# Patient Record
Sex: Female | Born: 1975 | Race: Black or African American | Hispanic: No | Marital: Single | Smoking: Never smoker
Health system: Southern US, Community
[De-identification: ages and names within clinical notes are randomized; demographics above are authoritative.]

## PROBLEM LIST (undated history)

## (undated) DIAGNOSIS — D219 Benign neoplasm of connective and other soft tissue, unspecified: Secondary | ICD-10-CM

## (undated) DIAGNOSIS — J45909 Unspecified asthma, uncomplicated: Secondary | ICD-10-CM

## (undated) DIAGNOSIS — O09529 Supervision of elderly multigravida, unspecified trimester: Secondary | ICD-10-CM

## (undated) HISTORY — DX: Benign neoplasm of connective and other soft tissue, unspecified: D21.9

## (undated) HISTORY — DX: Unspecified asthma, uncomplicated: J45.909

## (undated) HISTORY — PX: HYSTEROSCOPY: SHX211

## (undated) HISTORY — PX: TONSILLECTOMY: SUR1361

## (undated) HISTORY — DX: Supervision of elderly multigravida, unspecified trimester: O09.529

---

## 1998-05-30 ENCOUNTER — Other Ambulatory Visit: Admission: RE | Admit: 1998-05-30 | Discharge: 1998-05-30 | Payer: Self-pay | Admitting: Emergency Medicine

## 1999-01-01 ENCOUNTER — Encounter: Admission: RE | Admit: 1999-01-01 | Discharge: 1999-01-01 | Payer: Self-pay | Admitting: Gastroenterology

## 1999-06-26 ENCOUNTER — Other Ambulatory Visit: Admission: RE | Admit: 1999-06-26 | Discharge: 1999-06-26 | Payer: Self-pay | Admitting: Emergency Medicine

## 2000-07-24 ENCOUNTER — Other Ambulatory Visit: Admission: RE | Admit: 2000-07-24 | Discharge: 2000-07-24 | Payer: Self-pay | Admitting: Family Medicine

## 2002-10-27 ENCOUNTER — Other Ambulatory Visit: Admission: RE | Admit: 2002-10-27 | Discharge: 2002-10-27 | Payer: Self-pay | Admitting: Family Medicine

## 2003-11-21 ENCOUNTER — Other Ambulatory Visit: Admission: RE | Admit: 2003-11-21 | Discharge: 2003-11-21 | Payer: Self-pay | Admitting: Family Medicine

## 2004-11-16 ENCOUNTER — Other Ambulatory Visit: Admission: RE | Admit: 2004-11-16 | Discharge: 2004-11-16 | Payer: Self-pay | Admitting: Family Medicine

## 2006-01-20 ENCOUNTER — Other Ambulatory Visit: Admission: RE | Admit: 2006-01-20 | Discharge: 2006-01-20 | Payer: Self-pay | Admitting: Family Medicine

## 2008-08-18 ENCOUNTER — Other Ambulatory Visit: Admission: RE | Admit: 2008-08-18 | Discharge: 2008-08-18 | Payer: Self-pay | Admitting: Family Medicine

## 2010-01-10 ENCOUNTER — Other Ambulatory Visit: Admission: RE | Admit: 2010-01-10 | Discharge: 2010-01-10 | Payer: Self-pay | Admitting: Family Medicine

## 2010-08-09 ENCOUNTER — Other Ambulatory Visit: Payer: Self-pay | Admitting: Obstetrics and Gynecology

## 2010-08-09 ENCOUNTER — Ambulatory Visit (HOSPITAL_BASED_OUTPATIENT_CLINIC_OR_DEPARTMENT_OTHER): Admission: RE | Admit: 2010-08-09 | Payer: Self-pay | Source: Ambulatory Visit | Admitting: Obstetrics and Gynecology

## 2010-08-09 ENCOUNTER — Ambulatory Visit (HOSPITAL_COMMUNITY)
Admission: RE | Admit: 2010-08-09 | Discharge: 2010-08-09 | Disposition: A | Discharge status: Home / Self Care | Payer: Medicaid Other | Source: Ambulatory Visit | Attending: Obstetrics and Gynecology | Admitting: Obstetrics and Gynecology

## 2010-08-09 DIAGNOSIS — N938 Other specified abnormal uterine and vaginal bleeding: Secondary | ICD-10-CM | POA: Insufficient documentation

## 2010-08-09 DIAGNOSIS — N949 Unspecified condition associated with female genital organs and menstrual cycle: Secondary | ICD-10-CM | POA: Insufficient documentation

## 2010-08-09 DIAGNOSIS — D250 Submucous leiomyoma of uterus: Secondary | ICD-10-CM | POA: Insufficient documentation

## 2010-08-09 LAB — CBC
MCH: 27.6 pg (ref 26.0–34.0)
MCV: 83.9 fL (ref 78.0–100.0)
Platelets: 240 10*3/uL (ref 150–400)
RBC: 4.34 MIL/uL (ref 3.87–5.11)
RDW: 15.4 % (ref 11.5–15.5)
WBC: 4.9 10*3/uL (ref 4.0–10.5)

## 2010-08-09 LAB — BASIC METABOLIC PANEL
BUN: 8 mg/dL (ref 6–23)
Calcium: 9.1 mg/dL (ref 8.4–10.5)
GFR calc non Af Amer: >60 mL/min (ref >60)
Glucose, Bld: 89 mg/dL (ref 70–99)
Potassium: 3.6 mEq/L (ref 3.5–5.1)
Sodium: 133 mEq/L — ABNORMAL LOW (ref 135–145)

## 2010-08-09 LAB — PREGNANCY, URINE: Preg Test, Ur: NEGATIVE

## 2010-08-13 NOTE — Op Note (Signed)
  [name], Elizabeth Hensley                ACCOUNT NO.:  [id number]  MEDICAL RECORD NO.:  [id number]           PATIENT TYPE:  O  LOCATION:  [facility]                          [facility]  PHYSICIAN:  Crist Fat. Romualdo Prosise, M.D. DATE OF BIRTH:  [birth year]  DATE OF PROCEDURE:  08/09/2010 DATE OF DISCHARGE:                              OPERATIVE REPORT   PREOPERATIVE DIAGNOSIS:  Submucosal fibroid with menorrhagia.  POSTOPERATIVE DIAGNOSIS:  Submucosal fibroid with menorrhagia.  ANESTHESIA:  General.  PROCEDURES:  Hysteroscopy, resection of fibroid with VersaPoint, and D and C.  SURGEON:  Excell Neyland A. Amala Petion, M.D.  ASSISTANT:  No assistant.  ESTIMATED BLOOD LOSS:  Minimal.  WATER DEFICIT:  230 mL.  PROCEDURE:  After being informed of the planned procedure with possible complications including bleeding, infection, injury to uterus, and subsequently injury to intraabdominal organs.  Informed consent was obtained.  The patient was taken to OR #8, given general anesthesia with laryngeal mask ventilation without any complication.  She was placed in the lithotomy position, prepped and draped in a sterile fashion, and her bladder was emptied with an in-and-out red rubber catheter.  She also has knee-high sequential compressive devices.  Pelvic exam revealed a retroverted uterus, slightly enlarged, but regular in shape to normal adnexa.  A weighted speculum was inserted in the vagina and anterior lip of the cervix was grasped with a Jacobs forceps.  We proceeded with a paracervical block using 18 mL of Novocaine 1% and the uterus was sounded at 10 cm.  The cervix was easily dilated using Hegar dilator until #33 which allowed easy entry of the operative hysteroscope.  With perfusion of LR at a maximum pressure of 80 mmHg, we were able to visualize the intrauterine cavity with both tubal ostia.  Endometrium appeared thickened with one polyp at the fundus.  The patient was day #20 of a 28-day cycle.   We did see in the anterior fundal right sided a slight bulge in the endometrial cavity which could represent the previously identified submucosal fibroid, but unfortunately the submucosal component was very little.  Using the VersaPoint loop, we resected that portion until the anterior wall was completely flattened and we also resected the endometrial polyp.  We then removed the hysteroscope, proceeded with sharp curettage removing a large amount of normal-appearing endometrium.  Hysteroscope was reinserted to note a normal endometrial cavity.  All instruments were then removed.  Instruments and sponge count was complete x2.  Estimated blood loss was minimal and water deficit is 230 mL.  The procedure was well tolerated by the patient who was taken to recovery room in well and stable condition.  SPECIMEN:  Endometrial curettings, endometrial resections were sent to Pathology.     Crist Fat Kayani Rapaport, M.D.     SAR/MEDQ  D:  08/09/2010  T:  08/10/2010  Job:  [id number]  Electronically Signed by Silverio Lay M.D. on 08/13/2010 04:48:13 PM

## 2011-03-26 NOTE — L&D Delivery Note (Signed)
Patient was C/C+1 and pushed for just over 120 minutes without epidural.   Vacuum assisted delivery with one pop off of  Female infant, Occiput posterior, Apgars 9/9, weight pending.   The patient had a minor L labial abrasion, not bleeding. Fundus was firm, after manual extraction of approx 200cc clot. EBL was expected. Placenta was delivered intact. Vagina was clear.  Baby was vigorous to bedside.  Philip Aspen

## 2011-05-02 LAB — OB RESULTS CONSOLE RPR: RPR: NONREACTIVE

## 2011-05-02 LAB — OB RESULTS CONSOLE GC/CHLAMYDIA
Chlamydia: NEGATIVE
Gonorrhea: NEGATIVE

## 2011-05-02 LAB — OB RESULTS CONSOLE RUBELLA ANTIBODY, IGM: Rubella: IMMUNE

## 2011-05-02 LAB — OB RESULTS CONSOLE HIV ANTIBODY (ROUTINE TESTING): HIV: NONREACTIVE

## 2011-10-30 ENCOUNTER — Telehealth (HOSPITAL_COMMUNITY): Payer: Self-pay | Admitting: *Deleted

## 2011-10-30 NOTE — Telephone Encounter (Signed)
Preadmission screen  

## 2011-10-31 ENCOUNTER — Encounter (HOSPITAL_COMMUNITY): Payer: Self-pay | Admitting: *Deleted

## 2011-10-31 ENCOUNTER — Inpatient Hospital Stay (HOSPITAL_COMMUNITY)
Admission: AD | Admit: 2011-10-31 | Discharge: 2011-11-02 | DRG: 775 | Disposition: A | Discharge status: Home / Self Care | Payer: Medicaid Other | Source: Ambulatory Visit | Attending: Obstetrics and Gynecology | Admitting: Obstetrics and Gynecology

## 2011-10-31 DIAGNOSIS — O09529 Supervision of elderly multigravida, unspecified trimester: Secondary | ICD-10-CM | POA: Diagnosis present

## 2011-10-31 NOTE — MAU Note (Signed)
PT SAYS SROM AT 2209- CLEAR.   VE IN OFFICE - 4 CM.   DENIES HSV AND MERSA.    HURT BAD  AT 2300

## 2011-11-01 ENCOUNTER — Encounter (HOSPITAL_COMMUNITY): Payer: Self-pay | Admitting: Obstetrics

## 2011-11-01 LAB — CBC
HCT: 36.1 % (ref 36.0–46.0)
MCH: 28.1 pg (ref 26.0–34.0)
MCH: 28.3 pg (ref 26.0–34.0)
MCHC: 32.5 g/dL (ref 30.0–36.0)
MCV: 85.7 fL (ref 78.0–100.0)
MCV: 86.2 fL (ref 78.0–100.0)
Platelets: 190 10*3/uL (ref 150–400)
RBC: 4.21 MIL/uL (ref 3.87–5.11)
RDW: 18 % — ABNORMAL HIGH (ref 11.5–15.5)
WBC: 8.1 10*3/uL (ref 4.0–10.5)

## 2011-11-01 LAB — TYPE AND SCREEN: Antibody Screen: NEGATIVE

## 2011-11-01 MED ORDER — BENZOCAINE-MENTHOL 20-0.5 % EX AERO
1.0000 "application " | INHALATION_SPRAY | CUTANEOUS | Status: DC | PRN
  Filled 2011-11-01: qty 56

## 2011-11-01 MED ORDER — IBUPROFEN 600 MG PO TABS
600.0000 mg | ORAL_TABLET | Freq: Four times a day (QID) | ORAL | Status: DC
  Administered 2011-11-01 – 2011-11-02 (×6): 600 mg via ORAL
  Filled 2011-11-01 (×6): qty 1

## 2011-11-01 MED ORDER — SIMETHICONE 80 MG PO CHEW: 80.0000 mg | CHEWABLE_TABLET | ORAL | Status: DC | PRN

## 2011-11-01 MED ORDER — OXYTOCIN 40 UNITS IN LACTATED RINGERS INFUSION - SIMPLE MED
62.5000 mL/h | Freq: Once | INTRAVENOUS | Status: AC
  Administered 2011-11-01: 62.5 mL/h via INTRAVENOUS
  Filled 2011-11-01: qty 1000

## 2011-11-01 MED ORDER — FLEET ENEMA 7-19 GM/118ML RE ENEM: 1.0000 | ENEMA | RECTAL | Status: DC | PRN

## 2011-11-01 MED ORDER — OXYTOCIN BOLUS FROM INFUSION
250.0000 mL | Freq: Once | INTRAVENOUS | Status: DC
  Filled 2011-11-01: qty 500

## 2011-11-01 MED ORDER — DIPHENHYDRAMINE HCL 25 MG PO CAPS: 25.0000 mg | ORAL_CAPSULE | Freq: Four times a day (QID) | ORAL | Status: DC | PRN

## 2011-11-01 MED ORDER — ONDANSETRON HCL 4 MG/2ML IJ SOLN: 4.0000 mg | INTRAMUSCULAR | Status: DC | PRN

## 2011-11-01 MED ORDER — SENNOSIDES-DOCUSATE SODIUM 8.6-50 MG PO TABS
2.0000 | ORAL_TABLET | Freq: Every day | ORAL | Status: DC
  Administered 2011-11-01: 2 via ORAL

## 2011-11-01 MED ORDER — ONDANSETRON HCL 4 MG PO TABS: 4.0000 mg | ORAL_TABLET | ORAL | Status: DC | PRN

## 2011-11-01 MED ORDER — OXYCODONE-ACETAMINOPHEN 5-325 MG PO TABS
1.0000 | ORAL_TABLET | ORAL | Status: DC | PRN
  Administered 2011-11-01: 1 via ORAL
  Administered 2011-11-02: 2 via ORAL
  Filled 2011-11-01: qty 1
  Filled 2011-11-01: qty 2

## 2011-11-01 MED ORDER — ZOLPIDEM TARTRATE 5 MG PO TABS: 5.0000 mg | ORAL_TABLET | Freq: Every evening | ORAL | Status: DC | PRN

## 2011-11-01 MED ORDER — ACETAMINOPHEN 325 MG PO TABS: 650.0000 mg | ORAL_TABLET | ORAL | Status: DC | PRN

## 2011-11-01 MED ORDER — DIBUCAINE 1 % RE OINT: 1.0000 "application " | TOPICAL_OINTMENT | RECTAL | Status: DC | PRN

## 2011-11-01 MED ORDER — IBUPROFEN 600 MG PO TABS: 600.0000 mg | ORAL_TABLET | Freq: Four times a day (QID) | ORAL | Status: DC | PRN

## 2011-11-01 MED ORDER — LIDOCAINE HCL (PF) 1 % IJ SOLN
30.0000 mL | INTRAMUSCULAR | Status: DC | PRN
  Administered 2011-11-01: 30 mL via SUBCUTANEOUS
  Filled 2011-11-01: qty 30

## 2011-11-01 MED ORDER — PRENATAL MULTIVITAMIN CH
1.0000 | ORAL_TABLET | Freq: Every day | ORAL | Status: DC
  Administered 2011-11-02: 1 via ORAL
  Filled 2011-11-01: qty 1

## 2011-11-01 MED ORDER — ONDANSETRON HCL 4 MG/2ML IJ SOLN: 4.0000 mg | Freq: Four times a day (QID) | INTRAMUSCULAR | Status: DC | PRN

## 2011-11-01 MED ORDER — LACTATED RINGERS IV SOLN
INTRAVENOUS | Status: DC
  Administered 2011-11-01: 01:00:00 via INTRAVENOUS

## 2011-11-01 MED ORDER — LACTATED RINGERS IV SOLN: INTRAVENOUS | Status: DC

## 2011-11-01 MED ORDER — LANOLIN HYDROUS EX OINT: TOPICAL_OINTMENT | CUTANEOUS | Status: DC | PRN

## 2011-11-01 MED ORDER — TETANUS-DIPHTH-ACELL PERTUSSIS 5-2.5-18.5 LF-MCG/0.5 IM SUSP: 0.5000 mL | Freq: Once | INTRAMUSCULAR | Status: DC

## 2011-11-01 MED ORDER — OXYCODONE-ACETAMINOPHEN 5-325 MG PO TABS: 1.0000 | ORAL_TABLET | ORAL | Status: DC | PRN

## 2011-11-01 MED ORDER — LACTATED RINGERS IV SOLN
500.0000 mL | INTRAVENOUS | Status: DC | PRN
Start: 2011-11-01 — End: 2011-11-01

## 2011-11-01 MED ORDER — WITCH HAZEL-GLYCERIN EX PADS: 1.0000 "application " | MEDICATED_PAD | CUTANEOUS | Status: DC | PRN

## 2011-11-01 MED ORDER — CITRIC ACID-SODIUM CITRATE 334-500 MG/5ML PO SOLN
30.0000 mL | ORAL | Status: DC | PRN
  Filled 2011-11-01: qty 15

## 2011-11-01 MED ORDER — TERBUTALINE SULFATE 1 MG/ML IJ SOLN
INTRAMUSCULAR | Status: AC
  Administered 2011-11-01: 1 mg via INTRAMUSCULAR
  Filled 2011-11-01: qty 1

## 2011-11-01 NOTE — Progress Notes (Signed)
Post Partum Day 0 Subjective: no complaints, up ad lib, voiding and tolerating PO  Objective: Blood pressure 96/53, pulse 80, temperature 97.6 F (36.4 C), temperature source Oral, resp. rate 18, height 5\' 5"  (1.651 m), weight 76.204 kg (168 lb), SpO2 99.00%, unknown if currently breastfeeding.  Physical Exam:  General: alert, cooperative and appears stated age Lochia: appropriate Uterine Fundus: firm   Basename 11/01/11 0600 11/01/11 0035  HGB 10.8* 11.9*  HCT 33.2* 36.1    Assessment/Plan: Plan for discharge tomorrow and Breastfeeding Desires neonatal circ. R/B/A reviewed. Needs to pay circ fee    LOS: 1 day   Elizabeth Hensley H. 11/01/2011, 10:34 AM

## 2011-11-01 NOTE — H&P (Addendum)
36 y.o. [redacted]w[redacted]d  G2P1001 comes in c/o SROM at 10pm, feeling contractions  4cm in office.  Otherwise has good fetal movement and no bleeding.  Last delivery was 35yrs ago- no complications.  Past Medical History  Diagnosis Date  . Asthma     proventil  . AMA (advanced maternal age) multigravida 35+   . Fibroid     also a polyp    Past Surgical History  Procedure Date  . Hysteroscopy     versapoint hysteroscopic resection of Fibroids 07/2010  . Tonsillectomy     and adenoids    OB History    Grav Para Term Preterm Abortions TAB SAB Ect Mult Living   2 1 1       1      # Outc Date GA Lbr Len/2nd Wgt Sex Del Anes PTL Lv   1 TRM 1993 [redacted]w[redacted]d 05:00 6lb7oz(2.92kg) M SVD   Yes   2 CUR               History   Social History  . Marital Status: Single    Spouse Name: N/A    Number of Children: N/A  . Years of Education: N/A   Occupational History  . Not on file.   Social History Main Topics  . Smoking status: Never Smoker   . Smokeless tobacco: Never Used  . Alcohol Use: No  . Drug Use: No  . Sexually Active: Yes    Birth Control/ Protection: Pill   Other Topics Concern  . Not on file   Social History Narrative  . No narrative on file   Sulfa antibiotics; Vicodin; and Zinc   Prenatal Course:  AMA- nl integrated screen, hx of hysteroscopic fibroid removal  Filed Vitals:   11/01/11 0144  BP:   Pulse: 118  Temp: 97.7 F (36.5 C)  Resp: 20     Lungs/Cor:  NAD Abdomen:  soft, gravid Ex:  no cords, erythema SVE:  8 per MAU FHTs:  140s, good STV, NST R Toco:  q2   A/P   Admit L&D Pt declines epidural Routine orders  GBS Neg  Quantel Mcinturff

## 2011-11-01 NOTE — Progress Notes (Signed)
UR chart review completed.  

## 2011-11-02 MED ORDER — IBUPROFEN 600 MG PO TABS: 600.0000 mg | ORAL_TABLET | Freq: Four times a day (QID) | ORAL | Status: AC | PRN

## 2011-11-02 MED ORDER — OXYCODONE-ACETAMINOPHEN 5-325 MG PO TABS: 2.0000 | ORAL_TABLET | ORAL | Status: AC | PRN

## 2011-11-02 MED ORDER — DOCUSATE SODIUM 100 MG PO CAPS: 100.0000 mg | ORAL_CAPSULE | Freq: Two times a day (BID) | ORAL | Status: AC

## 2011-11-02 NOTE — Discharge Summary (Signed)
Obstetric Discharge Summary Reason for Admission: onset of labor Prenatal Procedures: ultrasound Intrapartum Procedures: vacuum Postpartum Procedures: none Complications-Operative and Postpartum: none Hemoglobin  Date Value Range Status  11/01/2011 10.8* 12.0 - 15.0 g/dL Final     HCT  Date Value Range Status  11/01/2011 33.2* 36.0 - 46.0 % Final    Physical Exam:  General: alert, cooperative and appears stated age 36: appropriate Uterine Fundus: firm  Discharge Diagnoses: Term Pregnancy-delivered  Discharge Information: Date: 11/02/2011 Activity: pelvic rest Diet: routine Medications: Ibuprofen, Colace and Vicodin Condition: improved Instructions: refer to practice specific booklet Discharge to: home Follow-up Information    Follow up with Elizabeth A, MD in 4 weeks. (For Hensley postpartum evaluation)    Contact information:   [address] [place] [address] [phone]          Newborn Data: Live born female  Birth Weight: 8 lb 9 oz (3884 g) APGAR: 8, 9  Home with mother.  Elizabeth Hensley. 11/02/2011, 8:37 AM

## 2011-11-05 ENCOUNTER — Inpatient Hospital Stay (HOSPITAL_COMMUNITY): Admission: RE | Admit: 2011-11-05 | Payer: Medicaid Other | Source: Ambulatory Visit

## 2012-01-22 ENCOUNTER — Ambulatory Visit: Payer: Self-pay | Admitting: Obstetrics and Gynecology

## 2012-03-12 ENCOUNTER — Encounter: Payer: Self-pay | Admitting: Obstetrics and Gynecology

## 2012-03-12 ENCOUNTER — Ambulatory Visit (INDEPENDENT_AMBULATORY_CARE_PROVIDER_SITE_OTHER): Payer: Medicaid Other | Admitting: Obstetrics and Gynecology

## 2012-03-12 VITALS — BP 100/68 | Ht 60.5 in | Wt 139.0 lb

## 2012-03-12 DIAGNOSIS — Z01419 Encounter for gynecological examination (general) (routine) without abnormal findings: Secondary | ICD-10-CM

## 2012-03-12 DIAGNOSIS — Z124 Encounter for screening for malignant neoplasm of cervix: Secondary | ICD-10-CM

## 2012-03-12 DIAGNOSIS — Z0000 Encounter for general adult medical examination without abnormal findings: Secondary | ICD-10-CM

## 2012-03-12 MED ORDER — NORGESTIMATE-ETH ESTRADIOL 0.25-35 MG-MCG PO TABS: 1.0000 | ORAL_TABLET | Freq: Every day | ORAL | Status: AC

## 2012-03-12 NOTE — Progress Notes (Signed)
The patient reports:just had a vaginal delivery on 11/01/2011 Baby boy named Tristen   Contraception:Sprintec  Last mammogram: 09/2010 Normal Last pap:01/18/2011   GC/Chlamydia cultures offered: declined HIV/RPR/HbsAg offered:  declined HSV 1 and 2 glycoprotein offered: declined  Menstrual cycle regular and monthly: Yes Menstrual flow normal: Yes  Urinary symptoms: none Normal bowel movements: Yes Reports abuse at home: No:   Subjective:    Elizabeth Hensley is a 36 y.o. female, G2P2002, who presents for an annual exam.     History   Social History  . Marital Status: Single    Spouse Name: N/A    Number of Children: N/A  . Years of Education: N/A   Social History Main Topics  . Smoking status: Never Smoker   . Smokeless tobacco: Never Used  . Alcohol Use: No  . Drug Use: No  . Sexually Active: Yes    Birth Control/ Protection: Pill   Other Topics Concern  . Not on file   Social History Narrative  . No narrative on file    Menstrual cycle:   LMP: No LMP recorded.           Cycle: normal  The following portions of the patient's history were reviewed and updated as appropriate: allergies, current medications, past family history, past medical history, past social history, past surgical history and problem list.  Review of Systems Pertinent items are noted in HPI. Breast:Negative for breast lump,nipple discharge or nipple retraction Gastrointestinal: Negative for abdominal pain, change in bowel habits or rectal bleeding Urinary:negative   Objective:    Breastfeeding? Unknown    Weight:  Wt Readings from Last 1 Encounters:  11/01/11 168 lb (76.204 kg)          BMI: There is no height or weight on file to calculate BMI.  General Appearance: Alert, appropriate appearance for age. No acute distress HEENT: Grossly normal Neck / Thyroid: Supple, no masses, nodes or enlargement Lungs: clear to auscultation bilaterally Back: No CVA tenderness Breast Exam: No masses  or nodes.No dimpling, nipple retraction or discharge. Cardiovascular: Regular rate and rhythm. S1, S2, no murmur Gastrointestinal: Soft, non-tender, no masses or organomegaly Pelvic Exam: Vulva and vagina appear normal. Bimanual exam reveals normal uterus and adnexa.RV Rectovaginal: not indicated Lymphatic Exam: Non-palpable nodes in neck, clavicular, axillary, or inguinal regions Skin: no rash or abnormalities Neurologic: Normal gait and speech, no tremor  Psychiatric: Alert and oriented, appropriate affect.     Assessment:    Normal gyn exam Contraceptive management    Plan:    pap smear return annually or prn STD screening: declined Contraception:oral contraceptives (estrogen/progesterone)Sprintec   Silverio Lay MD

## 2012-03-13 LAB — PAP IG W/ RFLX HPV ASCU

## 2012-12-18 ENCOUNTER — Other Ambulatory Visit: Payer: Self-pay | Admitting: Dermatology

## 2012-12-26 ENCOUNTER — Encounter (HOSPITAL_COMMUNITY): Payer: Self-pay

## 2012-12-26 ENCOUNTER — Emergency Department (HOSPITAL_COMMUNITY)
Admission: EM | Admit: 2012-12-26 | Discharge: 2012-12-27 | Disposition: A | Discharge status: Home / Self Care | Payer: Medicaid Other | Attending: Emergency Medicine | Admitting: Emergency Medicine

## 2012-12-26 DIAGNOSIS — M546 Pain in thoracic spine: Secondary | ICD-10-CM

## 2012-12-26 DIAGNOSIS — Z79899 Other long term (current) drug therapy: Secondary | ICD-10-CM | POA: Insufficient documentation

## 2012-12-26 DIAGNOSIS — J45909 Unspecified asthma, uncomplicated: Secondary | ICD-10-CM | POA: Insufficient documentation

## 2012-12-26 DIAGNOSIS — R109 Unspecified abdominal pain: Secondary | ICD-10-CM | POA: Insufficient documentation

## 2012-12-26 DIAGNOSIS — IMO0002 Reserved for concepts with insufficient information to code with codable children: Secondary | ICD-10-CM | POA: Insufficient documentation

## 2012-12-26 DIAGNOSIS — Z3202 Encounter for pregnancy test, result negative: Secondary | ICD-10-CM | POA: Insufficient documentation

## 2012-12-26 DIAGNOSIS — Z8742 Personal history of other diseases of the female genital tract: Secondary | ICD-10-CM | POA: Insufficient documentation

## 2012-12-26 LAB — URINALYSIS, ROUTINE W REFLEX MICROSCOPIC
Bilirubin Urine: NEGATIVE
Leukocytes, UA: NEGATIVE
Nitrite: NEGATIVE
Specific Gravity, Urine: 1.025 (ref 1.005–1.030)
Urobilinogen, UA: 0.2 mg/dL (ref 0.0–1.0)

## 2012-12-26 LAB — CBC WITH DIFFERENTIAL/PLATELET
Hemoglobin: 12.5 g/dL (ref 12.0–15.0)
Lymphs Abs: 2.6 10*3/uL (ref 0.7–4.0)
Monocytes Relative: 10 % (ref 3–12)
Neutro Abs: 1.6 10*3/uL — ABNORMAL LOW (ref 1.7–7.7)
Neutrophils Relative %: 32 % — ABNORMAL LOW (ref 43–77)
RBC: 4.19 MIL/uL (ref 3.87–5.11)

## 2012-12-26 MED ORDER — OXYCODONE-ACETAMINOPHEN 5-325 MG PO TABS
1.0000 | ORAL_TABLET | Freq: Once | ORAL | Status: AC
  Administered 2012-12-26: 1 via ORAL
  Filled 2012-12-26: qty 1

## 2012-12-26 NOTE — ED Notes (Signed)
Having pain in my right flank, stomach, and back per pt. Started several days ago and is getting worse per pt.

## 2012-12-27 ENCOUNTER — Emergency Department (HOSPITAL_COMMUNITY): Payer: Medicaid Other

## 2012-12-27 LAB — COMPREHENSIVE METABOLIC PANEL
ALT: 6 U/L (ref 0–35)
Albumin: 3.5 g/dL (ref 3.5–5.2)
BUN: 9 mg/dL (ref 6–23)
Calcium: 9.4 mg/dL (ref 8.4–10.5)
GFR calc Af Amer: >90 mL/min (ref >90)
Glucose, Bld: 111 mg/dL — ABNORMAL HIGH (ref 70–99)
Sodium: 137 mEq/L (ref 135–145)
Total Protein: 7.6 g/dL (ref 6.0–8.3)

## 2012-12-27 LAB — LIPASE, BLOOD: Lipase: 52 U/L (ref 11–59)

## 2012-12-27 MED ORDER — IBUPROFEN 800 MG PO TABS
800.0000 mg | ORAL_TABLET | Freq: Once | ORAL | Status: AC
  Administered 2012-12-27: 800 mg via ORAL
  Filled 2012-12-27: qty 1

## 2012-12-27 MED ORDER — IBUPROFEN 600 MG PO TABS: 600.0000 mg | ORAL_TABLET | Freq: Four times a day (QID) | ORAL | Status: AC | PRN

## 2012-12-27 MED ORDER — CYCLOBENZAPRINE HCL 5 MG PO TABS: 5.0000 mg | ORAL_TABLET | Freq: Three times a day (TID) | ORAL | Status: AC | PRN

## 2012-12-27 MED ORDER — CYCLOBENZAPRINE HCL 10 MG PO TABS
10.0000 mg | ORAL_TABLET | Freq: Once | ORAL | Status: AC
  Administered 2012-12-27: 10 mg via ORAL
  Filled 2012-12-27: qty 1

## 2012-12-30 NOTE — ED Provider Notes (Signed)
CSN: [id number]     Arrival date & time 12/26/12  2126 History   First MD Initiated Contact with Patient 12/26/12 2159     Chief Complaint  Patient presents with  . Flank Pain  . Back Pain  . Abdominal Pain   (Consider location/radiation/quality/duration/timing/severity/associated sxs/prior Treatment) HPI Comments: Elizabeth Hensley is a 37 y.o. Female presenting with a several day history of right mid back/flank pain which is described as cramping with episodes of sharpness which is intermittent and sporadic, sometimes triggered by movement but occurs at rest as well.  She denies dysuria, hematuria, shortness of breath, coughing and has had no fevers, chills, nausea, vomiting, anorexia and has not developed a rash at the site of pain.  It radiates to her right lateral upper abdomen.   She has taken ibuprofen without relief of symptoms.     The history is provided by the patient.    Past Medical History  Diagnosis Date  . Asthma     proventil  . AMA (advanced maternal age) multigravida 35+   . Fibroid     also a polyp   Past Surgical History  Procedure Laterality Date  . Hysteroscopy      versapoint hysteroscopic resection of Fibroids 07/2010  . Tonsillectomy      and adenoids  . Vaginal delivery     Family History  Problem Relation Age of Onset  . Hypertension Mother   . Cancer Mother     breast  . Hypertension Father   . Cancer Father     prostate   History  Substance Use Topics  . Smoking status: Never Smoker   . Smokeless tobacco: Never Used  . Alcohol Use: No   OB History   Grav Para Term Preterm Abortions TAB SAB Ect Mult Living   3 3 3       2      Review of Systems  Constitutional: Negative for fever and chills.  HENT: Negative for congestion and sore throat.   Eyes: Negative.   Respiratory: Negative for cough, chest tightness, shortness of breath and wheezing.   Cardiovascular: Negative for chest pain.  Gastrointestinal: Positive for abdominal pain.  Negative for nausea, vomiting, diarrhea, constipation and blood in stool.  Genitourinary: Positive for flank pain. Negative for dysuria, urgency and hematuria.  Musculoskeletal: Negative for arthralgias, joint swelling and neck pain.  Skin: Negative.  Negative for rash and wound.  Neurological: Negative for dizziness, weakness, light-headedness, numbness and headaches.  Psychiatric/Behavioral: Negative.     Allergies  Sulfa antibiotics; Zinc; and Vicodin  Home Medications   Current Outpatient Rx  Name  Route  Sig  Dispense  Refill  . ibuprofen (ADVIL,MOTRIN) 600 MG tablet   Oral   Take 600 mg by mouth once.         . loratadine (ALLERGY RELIEF) 10 MG tablet   Oral   Take 10 mg by mouth once.         . norgestimate-ethinyl estradiol (SPRINTEC 28) 0.25-35 MG-MCG tablet   Oral   Take 1 tablet by mouth daily.   3 Package   4   . triamcinolone cream (KENALOG) 0.1 %   Topical   Apply 1 application topically 2 (two) times daily as needed. For eczema, applies to face as needed         . triamcinolone ointment (KENALOG) 0.1 %   Topical   Apply 1 application topically daily. Applied to the body daily         .  albuterol (PROVENTIL HFA;VENTOLIN HFA) 108 (90 BASE) MCG/ACT inhaler   Inhalation   Inhale 2 puffs into the lungs every 6 (six) hours as needed. For shortness of breath         . cyclobenzaprine (FLEXERIL) 5 MG tablet   Oral   Take 1 tablet (5 mg total) by mouth 3 (three) times daily as needed for muscle spasms.   15 tablet   0   . ibuprofen (ADVIL,MOTRIN) 600 MG tablet   Oral   Take 1 tablet (600 mg total) by mouth every 6 (six) hours as needed for pain.   30 tablet   0    BP 111/74  Pulse 80  Temp(Src) 97.7 F (36.5 C) (Oral)  Resp 20  Ht 5\' 5"  (1.651 m)  Wt 143 lb (64.864 kg)  BMI 23.8 kg/m2  SpO2 98%  LMP 12/06/2012 Physical Exam  Nursing note and vitals reviewed. Constitutional: She appears well-developed and well-nourished.  HENT:  Head:  Normocephalic and atraumatic.  Eyes: Conjunctivae are normal.  Neck: Normal range of motion.  Cardiovascular: Normal rate, regular rhythm, normal heart sounds and intact distal pulses.   Pulmonary/Chest: Effort normal and breath sounds normal. She has no wheezes.  Abdominal: Soft. Bowel sounds are normal. She exhibits no distension. There is no hepatosplenomegaly. There is tenderness in the right upper quadrant. There is no rebound, no guarding and negative Murphy's sign.  ttp right flank,  No midline thoracic pain with palpation.  No rash,  No muscle spasm.    Musculoskeletal: Normal range of motion.  Neurological: She is alert.  Skin: Skin is warm and dry.  Psychiatric: She has a normal mood and affect.    ED Course  Procedures (including critical care time) Labs Review Labs Reviewed  URINALYSIS, ROUTINE W REFLEX MICROSCOPIC - Abnormal; Notable for the following:    Ketones, ur TRACE (*)    All other components within normal limits  COMPREHENSIVE METABOLIC PANEL - Abnormal; Notable for the following:    Glucose, Bld 111 (*)    Total Bilirubin 0.1 (*)    GFR calc non Af Amer 86 (*)    All other components within normal limits  CBC WITH DIFFERENTIAL - Abnormal; Notable for the following:    Neutrophils Relative % 32 (*)    Neutro Abs 1.6 (*)    Lymphocytes Relative 53 (*)    Eosinophils Relative 6 (*)    All other components within normal limits  LIPASE, BLOOD  POCT PREGNANCY, URINE   Imaging Review No results found.  MDM   1. Thoracic back pain    Patients labs and/or radiological studies were viewed and considered during the medical decision making and disposition process.  Labs reviewed,  lft's normal, doubt this is a gallbladder issue.  UA normal, no dysuria.  Pt was given oxycodone with no pain relief.  She was next give ibuprofen and flexeril which did improve her pain.  Probably musculoskeletal source.  Prescribed both ibuprofen and flexeril.  Encouraged heating pad,  f/u if not improved over the next week.   Burgess Amor, PA-C 12/30/12 2056

## 2013-01-01 NOTE — ED Provider Notes (Signed)
Medical screening examination/treatment/procedure(s) were performed by non-physician practitioner and as supervising physician I was immediately available for consultation/collaboration.  Tavious Griesinger, MD 01/01/13 2224 

## 2013-04-16 ENCOUNTER — Other Ambulatory Visit: Payer: Self-pay | Admitting: Family Medicine

## 2013-04-16 ENCOUNTER — Other Ambulatory Visit (HOSPITAL_COMMUNITY)
Admission: RE | Admit: 2013-04-16 | Discharge: 2013-04-16 | Disposition: A | Discharge status: Home / Self Care | Payer: Medicaid Other | Source: Ambulatory Visit | Attending: Family Medicine | Admitting: Family Medicine

## 2013-04-16 DIAGNOSIS — Z124 Encounter for screening for malignant neoplasm of cervix: Secondary | ICD-10-CM | POA: Insufficient documentation

## 2013-04-16 DIAGNOSIS — Z1151 Encounter for screening for human papillomavirus (HPV): Secondary | ICD-10-CM | POA: Insufficient documentation

## 2014-01-24 ENCOUNTER — Encounter (HOSPITAL_COMMUNITY): Payer: Self-pay

## 2014-04-22 ENCOUNTER — Other Ambulatory Visit: Payer: Self-pay | Admitting: Family Medicine

## 2014-04-22 ENCOUNTER — Other Ambulatory Visit (HOSPITAL_COMMUNITY)
Admission: RE | Admit: 2014-04-22 | Discharge: 2014-04-22 | Disposition: A | Discharge status: Home / Self Care | Payer: Medicaid Other | Source: Ambulatory Visit | Attending: Family Medicine | Admitting: Family Medicine

## 2014-04-22 ENCOUNTER — Other Ambulatory Visit: Payer: Self-pay

## 2014-04-22 DIAGNOSIS — Z124 Encounter for screening for malignant neoplasm of cervix: Secondary | ICD-10-CM | POA: Diagnosis not present

## 2014-04-22 DIAGNOSIS — Z1151 Encounter for screening for human papillomavirus (HPV): Secondary | ICD-10-CM | POA: Insufficient documentation

## 2014-04-22 DIAGNOSIS — Z1231 Encounter for screening mammogram for malignant neoplasm of breast: Secondary | ICD-10-CM

## 2014-04-26 LAB — CYTOLOGY - PAP

## 2014-05-10 ENCOUNTER — Encounter (INDEPENDENT_AMBULATORY_CARE_PROVIDER_SITE_OTHER): Payer: Self-pay

## 2014-05-10 ENCOUNTER — Ambulatory Visit
Admission: RE | Admit: 2014-05-10 | Discharge: 2014-05-10 | Disposition: A | Discharge status: Home / Self Care | Payer: Medicaid Other | Source: Ambulatory Visit

## 2014-05-10 DIAGNOSIS — Z1231 Encounter for screening mammogram for malignant neoplasm of breast: Secondary | ICD-10-CM

## 2014-05-11 ENCOUNTER — Other Ambulatory Visit: Payer: Self-pay | Admitting: *Deleted

## 2014-05-11 ENCOUNTER — Other Ambulatory Visit: Payer: Self-pay | Admitting: Internal Medicine

## 2014-05-11 DIAGNOSIS — R928 Other abnormal and inconclusive findings on diagnostic imaging of breast: Secondary | ICD-10-CM

## 2014-05-13 ENCOUNTER — Other Ambulatory Visit: Payer: Medicaid Other

## 2014-05-26 ENCOUNTER — Ambulatory Visit
Admission: RE | Admit: 2014-05-26 | Discharge: 2014-05-26 | Disposition: A | Discharge status: Home / Self Care | Payer: Medicaid Other | Source: Ambulatory Visit | Attending: Internal Medicine | Admitting: Internal Medicine

## 2014-05-26 DIAGNOSIS — R928 Other abnormal and inconclusive findings on diagnostic imaging of breast: Secondary | ICD-10-CM

## 2015-05-01 ENCOUNTER — Other Ambulatory Visit: Payer: Self-pay

## 2015-05-01 DIAGNOSIS — Z1231 Encounter for screening mammogram for malignant neoplasm of breast: Secondary | ICD-10-CM

## 2015-05-15 ENCOUNTER — Ambulatory Visit
Admission: RE | Admit: 2015-05-15 | Discharge: 2015-05-15 | Disposition: A | Discharge status: Home / Self Care | Payer: Medicaid Other | Source: Ambulatory Visit

## 2015-05-15 DIAGNOSIS — Z1231 Encounter for screening mammogram for malignant neoplasm of breast: Secondary | ICD-10-CM

## 2016-05-01 ENCOUNTER — Other Ambulatory Visit: Payer: Self-pay | Admitting: Family Medicine

## 2016-05-01 DIAGNOSIS — Z1231 Encounter for screening mammogram for malignant neoplasm of breast: Secondary | ICD-10-CM

## 2016-05-21 ENCOUNTER — Ambulatory Visit
Admission: RE | Admit: 2016-05-21 | Discharge: 2016-05-21 | Disposition: A | Discharge status: Home / Self Care | Payer: Medicaid Other | Source: Ambulatory Visit | Attending: Family Medicine | Admitting: Family Medicine

## 2016-05-21 DIAGNOSIS — Z1231 Encounter for screening mammogram for malignant neoplasm of breast: Secondary | ICD-10-CM

## 2017-05-15 ENCOUNTER — Other Ambulatory Visit: Payer: Self-pay | Admitting: Family Medicine

## 2017-05-15 DIAGNOSIS — Z1231 Encounter for screening mammogram for malignant neoplasm of breast: Secondary | ICD-10-CM

## 2017-06-04 ENCOUNTER — Ambulatory Visit
Admission: RE | Admit: 2017-06-04 | Discharge: 2017-06-04 | Disposition: A | Discharge status: Home / Self Care | Payer: Medicaid Other | Source: Ambulatory Visit | Attending: Family Medicine | Admitting: Family Medicine

## 2017-06-04 DIAGNOSIS — Z1231 Encounter for screening mammogram for malignant neoplasm of breast: Secondary | ICD-10-CM

## 2018-01-06 ENCOUNTER — Other Ambulatory Visit (HOSPITAL_COMMUNITY)
Admission: RE | Admit: 2018-01-06 | Discharge: 2018-01-06 | Disposition: A | Discharge status: Home / Self Care | Payer: Medicaid Other | Source: Ambulatory Visit | Attending: Family Medicine | Admitting: Family Medicine

## 2018-01-06 ENCOUNTER — Other Ambulatory Visit: Payer: Self-pay | Admitting: Family Medicine

## 2018-01-06 DIAGNOSIS — Z01411 Encounter for gynecological examination (general) (routine) with abnormal findings: Secondary | ICD-10-CM | POA: Insufficient documentation

## 2018-01-13 LAB — CYTOLOGY - PAP
HPV (WINDOPATH): DETECTED — AB
HPV 16/18/45 GENOTYPING: NEGATIVE

## 2018-01-23 ENCOUNTER — Ambulatory Visit
Admission: RE | Admit: 2018-01-23 | Discharge: 2018-01-23 | Disposition: A | Discharge status: Home / Self Care | Payer: Medicaid Other | Source: Ambulatory Visit | Attending: Family Medicine | Admitting: Family Medicine

## 2018-01-23 ENCOUNTER — Other Ambulatory Visit: Payer: Self-pay | Admitting: Family Medicine

## 2018-01-23 DIAGNOSIS — R52 Pain, unspecified: Secondary | ICD-10-CM

## 2018-03-03 ENCOUNTER — Other Ambulatory Visit: Payer: Self-pay | Admitting: Obstetrics and Gynecology

## 2018-08-07 ENCOUNTER — Other Ambulatory Visit: Payer: Self-pay | Admitting: Family Medicine

## 2018-08-07 DIAGNOSIS — Z1231 Encounter for screening mammogram for malignant neoplasm of breast: Secondary | ICD-10-CM

## 2018-08-26 ENCOUNTER — Ambulatory Visit
Admission: RE | Admit: 2018-08-26 | Discharge: 2018-08-26 | Disposition: A | Discharge status: Home / Self Care | Payer: Medicaid Other | Source: Ambulatory Visit | Attending: Family Medicine | Admitting: Family Medicine

## 2018-08-26 ENCOUNTER — Other Ambulatory Visit: Payer: Self-pay

## 2018-08-26 DIAGNOSIS — Z1231 Encounter for screening mammogram for malignant neoplasm of breast: Secondary | ICD-10-CM

## 2018-09-28 ENCOUNTER — Ambulatory Visit: Payer: Medicaid Other

## 2019-06-23 ENCOUNTER — Other Ambulatory Visit: Payer: Self-pay | Admitting: Obstetrics and Gynecology

## 2019-08-03 ENCOUNTER — Other Ambulatory Visit: Payer: Self-pay | Admitting: Family Medicine

## 2019-08-03 DIAGNOSIS — Z1231 Encounter for screening mammogram for malignant neoplasm of breast: Secondary | ICD-10-CM

## 2019-09-01 ENCOUNTER — Other Ambulatory Visit: Payer: Self-pay

## 2019-09-01 ENCOUNTER — Ambulatory Visit
Admission: RE | Admit: 2019-09-01 | Discharge: 2019-09-01 | Disposition: A | Discharge status: Home / Self Care | Payer: Medicaid Other | Source: Ambulatory Visit | Attending: Family Medicine | Admitting: Family Medicine

## 2019-09-01 DIAGNOSIS — Z1231 Encounter for screening mammogram for malignant neoplasm of breast: Secondary | ICD-10-CM

## 2020-04-01 ENCOUNTER — Ambulatory Visit: Payer: Medicaid Other

## 2020-04-13 ENCOUNTER — Other Ambulatory Visit: Payer: Self-pay | Admitting: Family Medicine

## 2020-04-13 DIAGNOSIS — Z1231 Encounter for screening mammogram for malignant neoplasm of breast: Secondary | ICD-10-CM

## 2020-04-28 ENCOUNTER — Ambulatory Visit: Payer: Medicaid Other

## 2020-06-02 ENCOUNTER — Telehealth: Payer: Self-pay | Admitting: *Deleted

## 2020-06-02 NOTE — Telephone Encounter (Signed)
Pt called because her son's father was notified the [place] [facility] clinic at [facility] is closed; she is concerned about her appts scheduled for 06/23/20 and 07/01/20; confirmed site is closed; reviewed vaccine locations through [facility]; she verbalized understanding and will consider using [facility] or a local pharmacy for vaccinations; will cancel previously scheduled appts at [facility]/

## 2020-06-23 ENCOUNTER — Ambulatory Visit: Payer: Medicaid Other

## 2020-07-21 ENCOUNTER — Ambulatory Visit: Payer: Medicaid Other

## 2020-09-04 ENCOUNTER — Ambulatory Visit
Admission: RE | Admit: 2020-09-04 | Discharge: 2020-09-04 | Disposition: A | Discharge status: Home / Self Care | Payer: Medicaid Other | Source: Ambulatory Visit | Attending: Family Medicine | Admitting: Family Medicine

## 2020-09-04 ENCOUNTER — Other Ambulatory Visit: Payer: Self-pay

## 2020-09-04 DIAGNOSIS — Z1231 Encounter for screening mammogram for malignant neoplasm of breast: Secondary | ICD-10-CM

## 2021-09-17 ENCOUNTER — Other Ambulatory Visit: Payer: Self-pay | Admitting: Family Medicine

## 2021-09-17 DIAGNOSIS — Z1231 Encounter for screening mammogram for malignant neoplasm of breast: Secondary | ICD-10-CM

## 2021-09-21 ENCOUNTER — Ambulatory Visit: Payer: Medicaid Other

## 2021-09-24 ENCOUNTER — Ambulatory Visit: Payer: Medicaid Other

## 2021-09-24 ENCOUNTER — Ambulatory Visit
Admission: RE | Admit: 2021-09-24 | Discharge: 2021-09-24 | Disposition: A | Discharge status: Home / Self Care | Payer: Medicaid Other | Source: Ambulatory Visit | Attending: Family Medicine | Admitting: Family Medicine

## 2021-09-24 DIAGNOSIS — Z1231 Encounter for screening mammogram for malignant neoplasm of breast: Secondary | ICD-10-CM

## 2022-05-08 ENCOUNTER — Other Ambulatory Visit: Payer: Self-pay | Admitting: Family Medicine

## 2022-05-08 DIAGNOSIS — Z1231 Encounter for screening mammogram for malignant neoplasm of breast: Secondary | ICD-10-CM

## 2022-09-12 IMAGING — MG MM DIGITAL SCREENING BILAT W/ TOMO AND CAD
8 series · 8 of 24 positions shown · non-contrast
Comparison: Previous exam(s).

CLINICAL DATA: Screening.

EXAM:
DIGITAL SCREENING BILATERAL MAMMOGRAM WITH TOMOSYNTHESIS AND CAD
TECHNIQUE: Bilateral screening digital craniocaudal and mediolateral oblique
mammograms were obtained. Bilateral screening digital breast
tomosynthesis was performed. The images were evaluated with
computer-aided detection.

[R CC synth-2D]
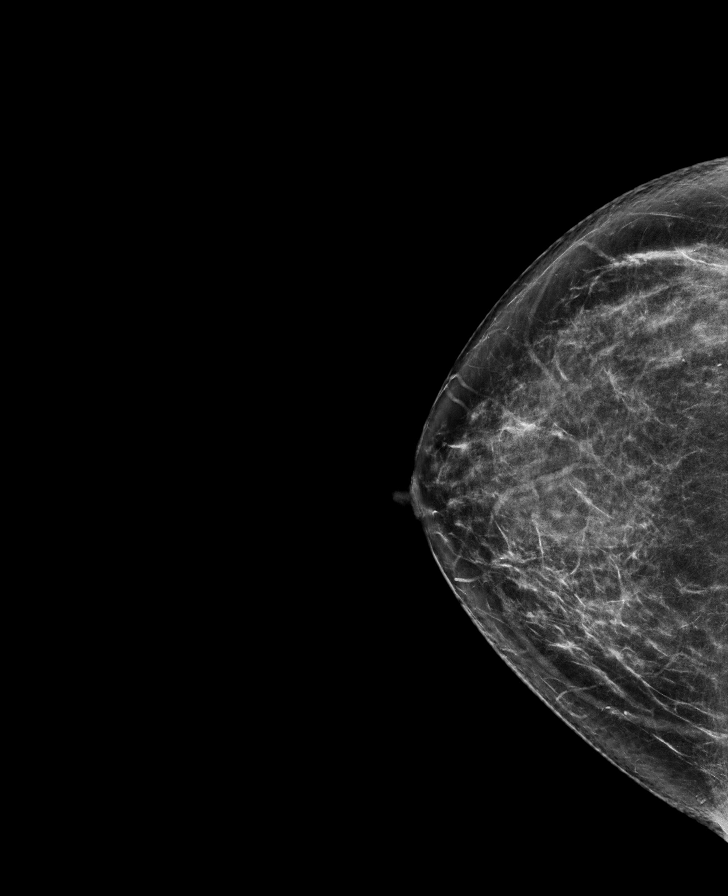

[L CC synth-2D]
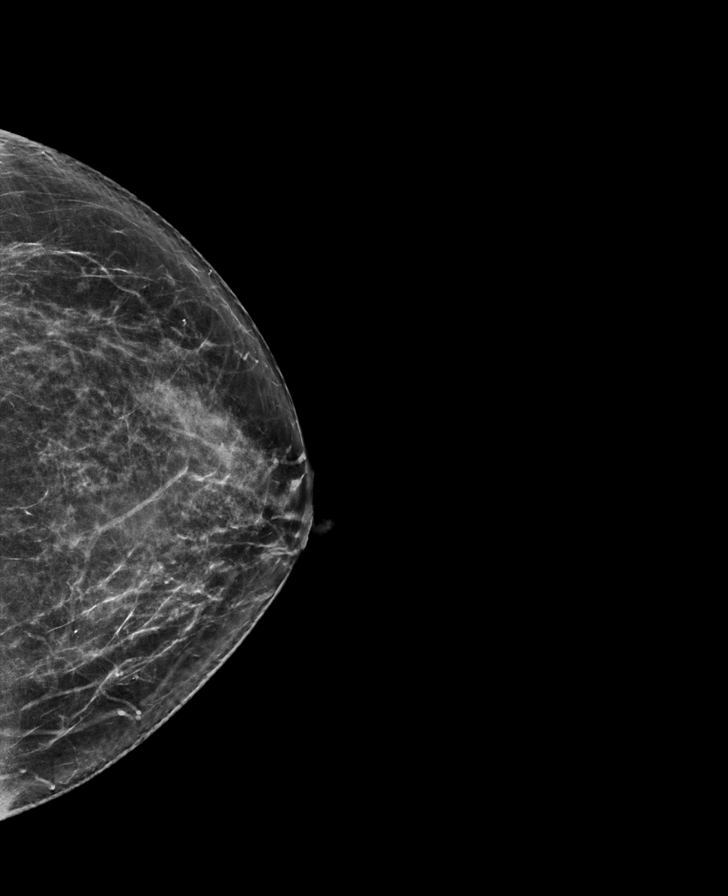

[R MLO synth-2D]
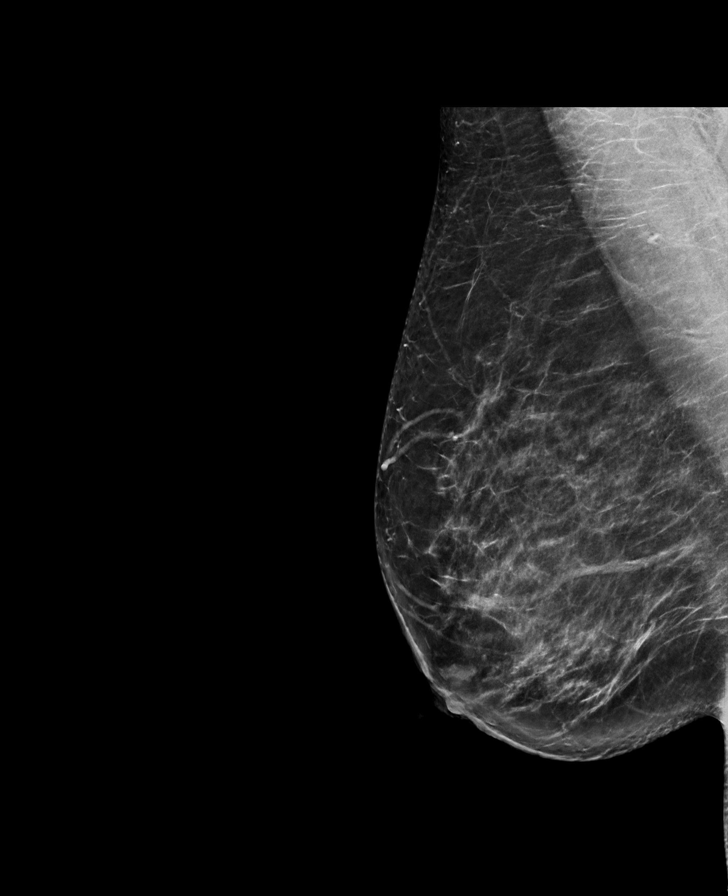

[L MLO synth-2D]
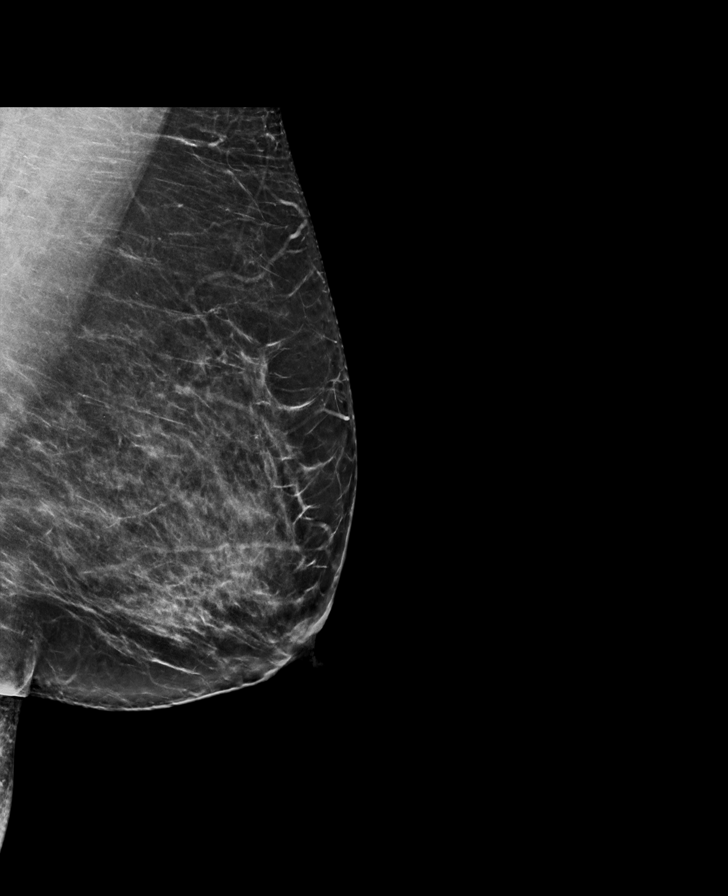

[R MLO tomo · tomo slice 42/83.0]
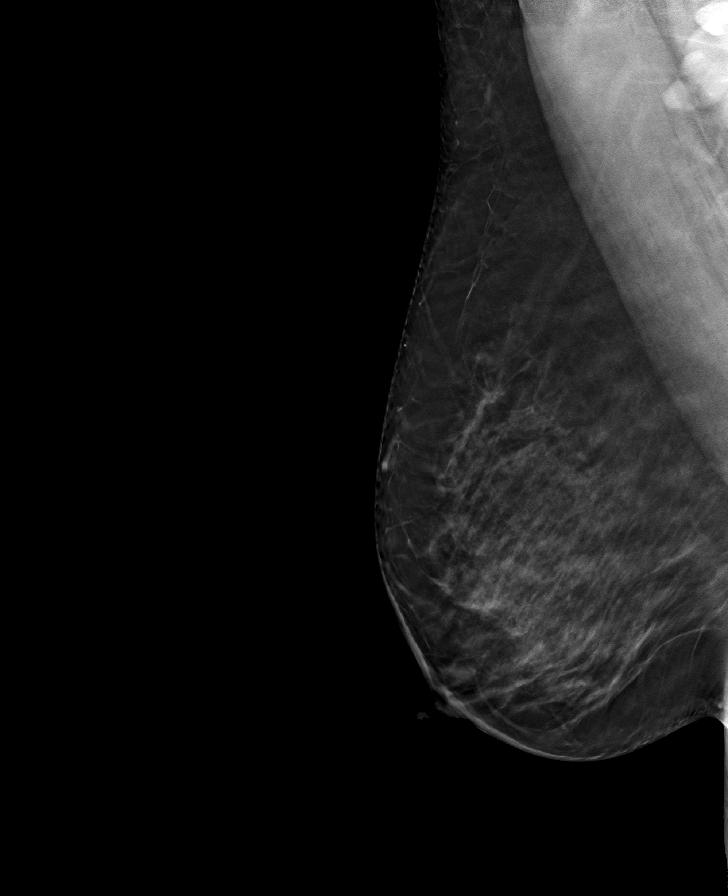

[R CC tomo · tomo slice 37/73.0]
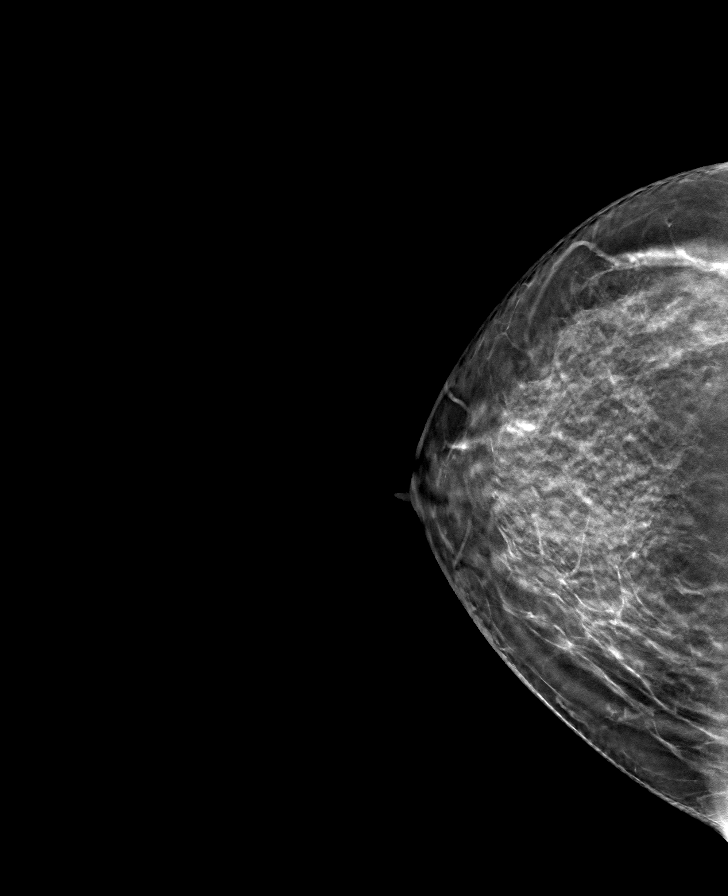

[L CC tomo · tomo slice 36/71.0]
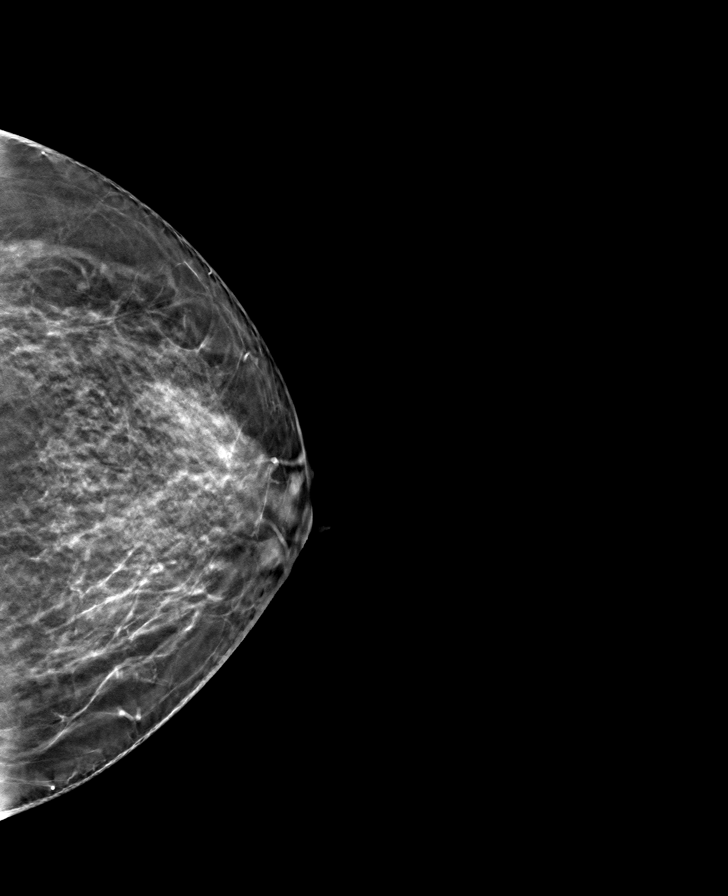

[L MLO tomo · tomo slice 39/77.0]
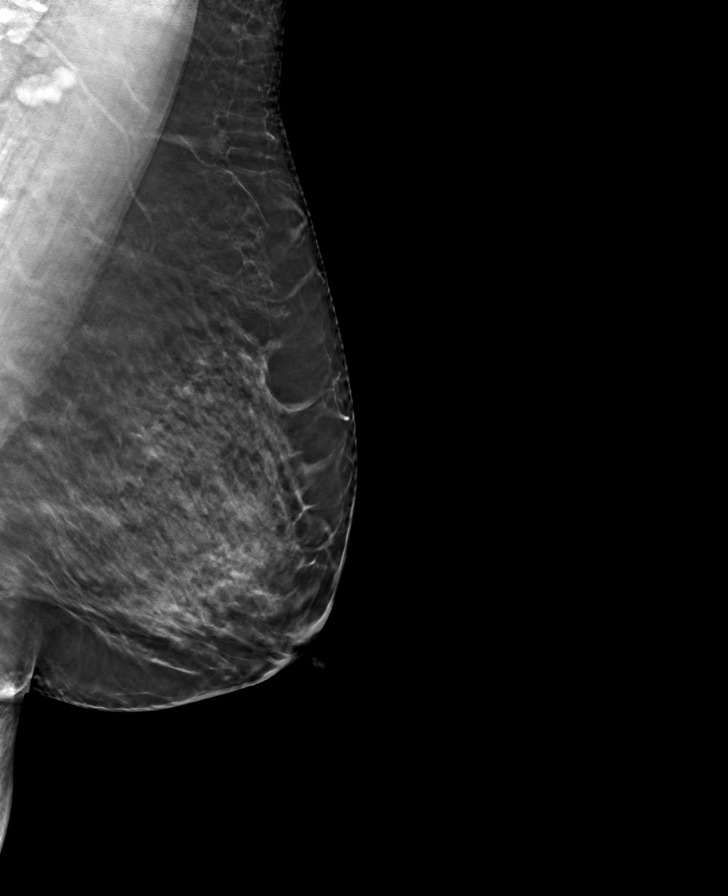

[8 of 24 positions shown; findings below may reference images not displayed]

ACR Breast Density Category c: The breast tissue is heterogeneously
dense, which may obscure small masses.
FINDINGS: There are no findings suspicious for malignancy. The images were
evaluated with computer-aided detection.
IMPRESSION: No mammographic evidence of malignancy. A result letter of this
screening mammogram will be mailed directly to the patient.

RECOMMENDATION:
Screening mammogram in one year. (Code:T4-5-GWO)

BI-RADS CATEGORY  1: Negative.

## 2022-09-27 ENCOUNTER — Ambulatory Visit: Admission: RE | Admit: 2022-09-27 | Payer: Medicaid Other | Source: Ambulatory Visit

## 2022-09-27 DIAGNOSIS — Z1231 Encounter for screening mammogram for malignant neoplasm of breast: Secondary | ICD-10-CM

## 2023-09-15 ENCOUNTER — Other Ambulatory Visit: Payer: Self-pay | Admitting: Family Medicine

## 2023-09-15 DIAGNOSIS — Z1231 Encounter for screening mammogram for malignant neoplasm of breast: Secondary | ICD-10-CM

## 2023-09-30 ENCOUNTER — Ambulatory Visit

## 2023-10-07 ENCOUNTER — Ambulatory Visit
Admission: RE | Admit: 2023-10-07 | Discharge: 2023-10-07 | Disposition: A | Discharge status: Home / Self Care | Source: Ambulatory Visit | Attending: Family Medicine | Admitting: Family Medicine

## 2023-10-07 DIAGNOSIS — Z1231 Encounter for screening mammogram for malignant neoplasm of breast: Secondary | ICD-10-CM
